# Patient Record
Sex: Female | Born: 2000 | Race: White | Hispanic: Yes | Marital: Single | Smoking: Never smoker
Health system: Southern US, Community
[De-identification: ages and names within clinical notes are randomized; demographics above are authoritative.]

---
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
## 2024-02-12 ENCOUNTER — Ambulatory Visit (INDEPENDENT_AMBULATORY_CARE_PROVIDER_SITE_OTHER): Admitting: Nurse Practitioner

## 2024-02-12 ENCOUNTER — Other Ambulatory Visit (HOSPITAL_COMMUNITY)
Admission: RE | Admit: 2024-02-12 | Discharge: 2024-02-12 | Disposition: A | Discharge status: Home / Self Care | Source: Ambulatory Visit | Attending: Nurse Practitioner | Admitting: Nurse Practitioner

## 2024-02-12 ENCOUNTER — Encounter: Payer: Self-pay | Admitting: Nurse Practitioner

## 2024-02-12 VITALS — BP 104/62 | HR 77 | Temp 97.6°F | Ht 67.0 in | Wt 175.0 lb

## 2024-02-12 DIAGNOSIS — K5900 Constipation, unspecified: Secondary | ICD-10-CM

## 2024-02-12 DIAGNOSIS — D649 Anemia, unspecified: Secondary | ICD-10-CM | POA: Diagnosis not present

## 2024-02-12 DIAGNOSIS — Z136 Encounter for screening for cardiovascular disorders: Secondary | ICD-10-CM

## 2024-02-12 DIAGNOSIS — R5383 Other fatigue: Secondary | ICD-10-CM

## 2024-02-12 DIAGNOSIS — Z113 Encounter for screening for infections with a predominantly sexual mode of transmission: Secondary | ICD-10-CM | POA: Insufficient documentation

## 2024-02-12 DIAGNOSIS — Z23 Encounter for immunization: Secondary | ICD-10-CM

## 2024-02-12 LAB — COMPREHENSIVE METABOLIC PANEL WITH GFR
ALT: 12 U/L (ref 6–29)
AST: 15 U/L (ref 10–30)
Albumin: 4.7 g/dL (ref 3.6–5.1)
Alkaline phosphatase (APISO): 71 U/L (ref 31–125)
BUN: 15 mg/dL (ref 7–25)
Chloride: 105 mmol/L (ref 98–110)
Creat: 0.7 mg/dL (ref 0.50–0.96)
Potassium: 4.1 mmol/L (ref 3.5–5.3)
Total Protein: 7.3 g/dL (ref 6.1–8.1)
eGFR: 126 mL/min/{1.73_m2} (ref >=60)

## 2024-02-12 LAB — CBC WITH DIFFERENTIAL/PLATELET
Absolute Lymphocytes: 1466 {cells}/uL (ref 850–3900)
Absolute Monocytes: 685 {cells}/uL (ref 200–950)
Basophils Absolute: 38 {cells}/uL (ref 0–200)
Eosinophils Absolute: 211 {cells}/uL (ref 15–500)
Eosinophils Relative: 3.3 %
HCT: 44.1 % (ref 35.0–45.0)
Hemoglobin: 13.9 g/dL (ref 11.7–15.5)
MCH: 28.7 pg (ref 27.0–33.0)
MPV: 12.4 fL (ref 7.5–12.5)
Neutrophils Relative %: 62.5 %
RBC: 4.85 10*6/uL (ref 3.80–5.10)
RDW: 13.2 % (ref 11.0–15.0)
Total Lymphocyte: 22.9 %
WBC: 6.4 10*3/uL (ref 3.8–10.8)

## 2024-02-12 LAB — LIPID PANEL
Cholesterol: 159 mg/dL (ref <200)
LDL Cholesterol (Calc): 83 mg/dL

## 2024-02-12 NOTE — Progress Notes (Signed)
 New Patient Visit  BP 104/62 (BP Location: Left Arm, Patient Position: Sitting, Cuff Size: Normal)   Pulse 77   Temp 97.6 F (36.4 C)   Ht 5' 7 (1.702 m)   Wt 175 lb (79.4 kg)   LMP 01/28/2024 (Approximate)   SpO2 98%   BMI 27.41 kg/m    Subjective:    Patient ID: Mariah Browning, female    DOB: 01-Mar-2001, 23 y.o.   MRN: [id number]  CC: Chief Complaint  Patient presents with   Establish Care    NP. Est. Care, concerns with constipation    HPI: Mariah Browning is a 23 y.o. female presents for new patient visit to establish care.  Introduced to Publishing rights manager role and practice setting.  All questions answered.  Discussed provider/patient relationship and expectations.  Discussed the use of AI scribe software for clinical note transcription with the patient, who gave verbal consent to proceed.  History of Present Illness   The patient is a 23 year old who presents to establish care and experiences constipation.  She has experienced constipation for about a month, with infrequent and small bowel movements. Over-the-counter laxatives lead to diarrhea-like stools without satisfactory resolution. She denies stomach pain, nausea, or blood in her stool.  She recently changed her diet to include healthier options like chicken and rice and has been avoiding fast food for two weeks. There is no observed correlation between these dietary changes and the onset of constipation.  She frequently feels tired and occasionally takes iron supplements, though not regularly. She denies other symptoms such as chest pain, shortness of breath, dizziness, headaches, or urinary symptoms.     Depression and Anxiety Screen done:     02/12/2024    3:49 PM 02/12/2024    3:02 PM  Depression screen PHQ 2/9  Decreased Interest 0 0  Down, Depressed, Hopeless 0 0  PHQ - 2 Score 0 0  Altered sleeping 1   Tired, decreased energy 1   Change in appetite 1   Feeling bad or failure about yourself   0   Trouble concentrating 1   Moving slowly or fidgety/restless 0   Suicidal thoughts 0   PHQ-9 Score 4   Difficult doing work/chores Somewhat difficult       02/12/2024    3:49 PM  GAD 7 : Generalized Anxiety Score  Nervous, Anxious, on Edge 1  Control/stop worrying 2  Worry too much - different things 2  Trouble relaxing 1  Restless 0  Easily annoyed or irritable 1  Afraid - awful might happen 2  Total GAD 7 Score 9  Anxiety Difficulty Somewhat difficult     History reviewed. No pertinent past medical history.  History reviewed. No pertinent surgical history.  History reviewed. No pertinent family history.   Social History   Tobacco Use   Smoking status: Never   Smokeless tobacco: Never  Vaping Use   Vaping status: Never Used  Substance Use Topics   Alcohol use: Yes    Comment: once a month   Drug use: Never    No current outpatient medications on file prior to visit.   No current facility-administered medications on file prior to visit.     Review of Systems  Constitutional:  Positive for fatigue. Negative for fever.  HENT: Negative.    Respiratory: Negative.    Cardiovascular: Negative.   Gastrointestinal:  Positive for constipation. Negative for abdominal pain, diarrhea, nausea and vomiting.  Genitourinary: Negative.   Musculoskeletal: Negative.  Skin: Negative.   Neurological: Negative.   Psychiatric/Behavioral: Negative.        Objective:    BP 104/62 (BP Location: Left Arm, Patient Position: Sitting, Cuff Size: Normal)   Pulse 77   Temp 97.6 F (36.4 C)   Ht 5' 7 (1.702 m)   Wt 175 lb (79.4 kg)   LMP 01/28/2024 (Approximate)   SpO2 98%   BMI 27.41 kg/m   Wt Readings from Last 3 Encounters:  02/12/24 175 lb (79.4 kg)  04/28/20 125 lb (56.7 kg) (53%, Z= 0.08)*   * Growth percentiles are based on CDC (Girls, 2-20 Years) data.    BP Readings from Last 3 Encounters:  02/12/24 104/62  04/28/20 114/68 (61%, Z = 0.28 /  59%, Z = 0.23)*   11/30/15 127/80   *BP percentiles are based on the 2017 AAP Clinical Practice Guideline for girls    Physical Exam Vitals and nursing note reviewed.  Constitutional:      General: She is not in acute distress.    Appearance: Normal appearance.  HENT:     Head: Normocephalic and atraumatic.     Right Ear: Tympanic membrane, ear canal and external ear normal.     Left Ear: Tympanic membrane, ear canal and external ear normal.   Eyes:     Conjunctiva/sclera: Conjunctivae normal.    Cardiovascular:     Rate and Rhythm: Normal rate and regular rhythm.     Pulses: Normal pulses.     Heart sounds: Normal heart sounds.  Pulmonary:     Effort: Pulmonary effort is normal.     Breath sounds: Normal breath sounds.  Abdominal:     Palpations: Abdomen is soft.     Tenderness: There is no abdominal tenderness.   Musculoskeletal:        General: Normal range of motion.     Cervical back: Normal range of motion and neck supple.     Right lower leg: No edema.     Left lower leg: No edema.  Lymphadenopathy:     Cervical: No cervical adenopathy.   Skin:    General: Skin is warm and dry.   Neurological:     General: No focal deficit present.     Mental Status: She is alert and oriented to person, place, and time.     Cranial Nerves: No cranial nerve deficit.     Coordination: Coordination normal.     Gait: Gait normal.   Psychiatric:        Mood and Affect: Mood normal.        Behavior: Behavior normal.        Thought Content: Thought content normal.        Judgment: Judgment normal.        Assessment & Plan:   Problem List Items Addressed This Visit       Other   Constipation - Primary   She has experienced chronic constipation for a month, characterized by infrequent, small bowel movements. Increase water intake to 64 ounces daily and dietary fiber through high-fiber foods or supplements like Metamucil or Benefiber. Use MiraLAX daily as needed.       Relevant Orders    TSH (Completed)   Other Visit Diagnoses       Screen for STD (sexually transmitted disease)       Screen STDs, no current symptoms   Relevant Orders   HIV Antibody (routine testing w rflx)   Hepatitis C antibody   Urine  cytology ancillary only     Anemia, unspecified type       History of anemia, taking OTC iron supplement. Check CBC, iron panel, B12 today.   Relevant Orders   CBC with Differential/Platelet (Completed)   Comprehensive metabolic panel with GFR (Completed)   Vitamin B12 (Completed)   Iron, TIBC and Ferritin Panel (Completed)     Fatigue, unspecified type       Check CMP, CBC, iron, TSH today. Recommend regular sleep and exercise.     Screening for cardiovascular condition       Screen baseline lipid panel today   Relevant Orders   Lipid panel (Completed)     Immunization due       HPV #3 and Meningitis B #2 given today.   Relevant Orders   HPV 9-valent vaccine,Recombinat (Completed)   Meningococcal B, OMV (Completed)        Follow up plan: Return in about 3 months (around 05/14/2024) for CPE.  Paytan Recine A Rayley Gao

## 2024-02-12 NOTE — Patient Instructions (Signed)
 It was great to see you!  We are checking your labs today and will let you know the results via mychart/phone.   Increase fiber in your diet, you can add in metamucil or benefiber if needed  Make sure you are drinking plenty of water  I recommend miralax daily as needed for constipation   Let's follow-up in 2-3 months, sooner if you have concerns.  If a referral was placed today, you will be contacted for an appointment. Please note that routine referrals can sometimes take up to 3-4 weeks to process. Please call our office if you haven't heard anything after this time frame.  Take care,  Rheba Cedar, NP

## 2024-02-13 ENCOUNTER — Ambulatory Visit: Payer: Self-pay | Admitting: Nurse Practitioner

## 2024-02-13 DIAGNOSIS — K5900 Constipation, unspecified: Secondary | ICD-10-CM | POA: Insufficient documentation

## 2024-02-13 NOTE — Assessment & Plan Note (Signed)
 She has experienced chronic constipation for a month, characterized by infrequent, small bowel movements. Increase water intake to 64 ounces daily and dietary fiber through high-fiber foods or supplements like Metamucil or Benefiber. Use MiraLAX daily as needed.

## 2024-02-14 LAB — COMPREHENSIVE METABOLIC PANEL WITH GFR
AG Ratio: 1.8 (calc) (ref 1.0–2.5)
CO2: 25 mmol/L (ref 20–32)
Calcium: 9.7 mg/dL (ref 8.6–10.2)
Globulin: 2.6 g/dL (ref 1.9–3.7)
Glucose, Bld: 105 mg/dL — ABNORMAL HIGH (ref 65–99)
Sodium: 139 mmol/L (ref 135–146)
Total Bilirubin: 0.4 mg/dL (ref 0.2–1.2)

## 2024-02-14 LAB — CBC WITH DIFFERENTIAL/PLATELET
Basophils Relative: 0.6 %
MCHC: 31.5 g/dL — ABNORMAL LOW (ref 32.0–36.0)
MCV: 90.9 fL (ref 80.0–100.0)
Monocytes Relative: 10.7 %
Neutro Abs: 4000 {cells}/uL (ref 1500–7800)
Platelets: 279 10*3/uL (ref 140–400)

## 2024-02-14 LAB — IRON,TIBC AND FERRITIN PANEL
%SAT: 13 % — ABNORMAL LOW (ref 16–45)
Ferritin: 37 ng/mL (ref 16–154)
Iron: 47 ng/mL (ref 40–190)
TIBC: 362 ug/dL (ref 250–450)

## 2024-02-14 LAB — VITAMIN B12: Vitamin B-12: 411 pg/mL (ref 200–1100)

## 2024-02-14 LAB — HIV ANTIBODY (ROUTINE TESTING W REFLEX): HIV 1&2 Ab, 4th Generation: NONREACTIVE

## 2024-02-14 LAB — LIPID PANEL
HDL: 56 mg/dL (ref >=50)
Non-HDL Cholesterol (Calc): 103 mg/dL (ref <130)
Total CHOL/HDL Ratio: 2.8 (calc) (ref <5.0)
Triglycerides: 105 mg/dL (ref <150)

## 2024-02-14 LAB — HEPATITIS C ANTIBODY: Hepatitis C Ab: NONREACTIVE

## 2024-02-14 LAB — TSH: TSH: 1.86 m[IU]/L

## 2024-02-16 LAB — URINE CYTOLOGY ANCILLARY ONLY
Chlamydia: NEGATIVE
Comment: NEGATIVE
Comment: NORMAL
Neisseria Gonorrhea: NEGATIVE

## 2024-05-14 ENCOUNTER — Encounter: Admitting: Nurse Practitioner

## 2024-05-27 ENCOUNTER — Encounter: Admitting: Nurse Practitioner

## 2024-06-17 ENCOUNTER — Encounter: Admitting: Nurse Practitioner

## 2024-07-05 ENCOUNTER — Telehealth: Payer: Self-pay | Admitting: Nurse Practitioner

## 2024-07-05 NOTE — Telephone Encounter (Signed)
 05/27/2024 same day cancellation & 06/17/2024 no show  Final warning sent via mail and mychart

## 2024-07-05 NOTE — Telephone Encounter (Signed)
 Noted
